# Patient Record
Sex: Female | Born: 1985 | Race: White | Hispanic: No | Marital: Married | Smoking: Never smoker
Health system: Southern US, Community
[De-identification: ages and names within clinical notes are randomized; demographics above are authoritative.]

## PROBLEM LIST (undated history)

## (undated) DIAGNOSIS — G4730 Sleep apnea, unspecified: Secondary | ICD-10-CM

## (undated) DIAGNOSIS — G43909 Migraine, unspecified, not intractable, without status migrainosus: Secondary | ICD-10-CM

## (undated) HISTORY — DX: Sleep apnea, unspecified: G47.30

## (undated) HISTORY — PX: OTHER SURGICAL HISTORY: SHX169

---
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
## 2010-11-29 ENCOUNTER — Ambulatory Visit: Payer: Self-pay | Admitting: Women's Health

## 2010-12-19 ENCOUNTER — Encounter: Payer: Self-pay | Admitting: Gynecology

## 2010-12-19 ENCOUNTER — Other Ambulatory Visit (HOSPITAL_COMMUNITY)
Admission: RE | Admit: 2010-12-19 | Discharge: 2010-12-19 | Disposition: A | Discharge status: Home / Self Care | Payer: Managed Care, Other (non HMO) | Source: Ambulatory Visit | Attending: Women's Health | Admitting: Women's Health

## 2010-12-19 ENCOUNTER — Ambulatory Visit (INDEPENDENT_AMBULATORY_CARE_PROVIDER_SITE_OTHER): Payer: Managed Care, Other (non HMO) | Admitting: Women's Health

## 2010-12-19 VITALS — BP 120/70 | Ht 65.0 in | Wt 161.0 lb

## 2010-12-19 DIAGNOSIS — Z01419 Encounter for gynecological examination (general) (routine) without abnormal findings: Secondary | ICD-10-CM

## 2010-12-19 DIAGNOSIS — J45909 Unspecified asthma, uncomplicated: Secondary | ICD-10-CM | POA: Insufficient documentation

## 2010-12-19 DIAGNOSIS — G4730 Sleep apnea, unspecified: Secondary | ICD-10-CM | POA: Insufficient documentation

## 2010-12-19 DIAGNOSIS — Z113 Encounter for screening for infections with a predominantly sexual mode of transmission: Secondary | ICD-10-CM

## 2010-12-19 DIAGNOSIS — IMO0001 Reserved for inherently not codable concepts without codable children: Secondary | ICD-10-CM

## 2010-12-19 DIAGNOSIS — Z309 Encounter for contraceptive management, unspecified: Secondary | ICD-10-CM

## 2010-12-19 MED ORDER — NORGESTIM-ETH ESTRAD TRIPHASIC 0.18/0.215/0.25 MG-35 MCG PO TABS: 1.0000 | ORAL_TABLET | Freq: Every day | ORAL | Status: AC

## 2010-12-19 NOTE — Progress Notes (Signed)
Lindsay Spencer 28-Jun-1985 [id number]    History:    The patient presents for annual exam.  Graduated from a culinary school.    Past medical history, past surgical history, family history and social history were all reviewed and documented in the EPIC chart.   ROS:  A  ROS was performed and pertinent positives and negatives are included in the history.  Exam:  Filed Vitals:   12/19/10 1527  BP: 120/70    General appearance:  Normal Head/Neck:  Normal, without cervical or supraclavicular adenopathy. Thyroid:  Symmetrical, normal in size, without palpable masses or nodularity. Respiratory  Effort:  Normal  Auscultation:  Clear without wheezing or rhonchi Cardiovascular  Auscultation:  Regular rate, without rubs, murmurs or gallops  Edema/varicosities:  Not grossly evident Abdominal  Soft,nontender, without masses, guarding or rebound.  Liver/spleen:  No organomegaly noted  Hernia:  None appreciated  Skin  Inspection:  Grossly normal  Palpation:  Grossly normal Neurologic/psychiatric  Orientation:  Normal with appropriate conversation.  Mood/affect:  Normal  Genitourinary    Breasts: Examined lying and sitting.     Right: Without masses, retractions, discharge or axillary adenopathy.     Left: Without masses, retractions, discharge or axillary adenopathy.   Inguinal/mons:  Normal without inguinal adenopathy  External genitalia:  Normal  BUS/Urethra/Skene's glands:  Normal  Bladder:  Normal  Vagina:  Normal  Cervix:  Normal  Uterus:   normal in size, shape and contour.  Midline and mobile  Adnexa/parametria:     Rt: Without masses or tenderness.   Lt: Without masses or tenderness.  Anus and perineum: Normal  Digital rectal exam: Normal sphincter tone without palpated masses or tenderness  Assessment/Plan:  25 y.o. MWF G0 for annual exam. 6 day monthly cycle on Ortho Tri-Cyclen with no complaints. History of asthma. History of normal Paps, did not receive  Gardasil. First partner, but not his. Donates blood on a regular basis. Had a normal CBC, lipid profile, glucose,UA last month at primary care.  Normal GYN exam  Plan: Ortho Tri-Cyclen prescription, proper use, slight risk for blood clots and strokes was reviewed. Pap, GC Chlamydia only. SBEs, exercise, MVI daily, calcium rich diet encouraged.   Harrington Challenger [facility], 5:28 PM 12/19/2010

## 2010-12-19 NOTE — Patient Instructions (Signed)
tdap vaccine   

## 2011-01-17 ENCOUNTER — Ambulatory Visit (INDEPENDENT_AMBULATORY_CARE_PROVIDER_SITE_OTHER): Payer: Managed Care, Other (non HMO) | Admitting: Women's Health

## 2011-01-17 ENCOUNTER — Encounter: Payer: Self-pay | Admitting: Women's Health

## 2011-01-17 DIAGNOSIS — N898 Other specified noninflammatory disorders of vagina: Secondary | ICD-10-CM

## 2011-01-17 DIAGNOSIS — N920 Excessive and frequent menstruation with regular cycle: Secondary | ICD-10-CM

## 2011-01-17 NOTE — Progress Notes (Signed)
Patient ID: Lindsay Spencer, female   DOB: March 09, 1985, 25 y.o.   MRN: [id number] Presents with a complaint of spotting on second week of pills. Has been on tri Sprintec for about 3 years and has not ever had spotting prior. Denies missed or late pills.  Same partner, negative cultures, denies discharge or urinary symptoms. Has not been on any other medications recently.  Exam: No CVAT, abdomen was soft nontender, external genitalia is within normal limits, speculum exam no noted blood or discharge or erythema. Bimanual no CMT or adnexal fullness or tenderness. U PT negative.  Spotting/unknown origin  Plan: Continue taking pills daily, return to office if no cycle at placebo week or if spotting continues.

## 2011-04-29 ENCOUNTER — Emergency Department (HOSPITAL_COMMUNITY)
Admission: EM | Admit: 2011-04-29 | Discharge: 2011-04-30 | Disposition: A | Discharge status: Home / Self Care | Payer: Managed Care, Other (non HMO) | Attending: Emergency Medicine | Admitting: Emergency Medicine

## 2011-04-29 ENCOUNTER — Encounter (HOSPITAL_COMMUNITY): Payer: Self-pay | Admitting: Emergency Medicine

## 2011-04-29 DIAGNOSIS — M545 Low back pain, unspecified: Secondary | ICD-10-CM | POA: Insufficient documentation

## 2011-04-29 DIAGNOSIS — R5381 Other malaise: Secondary | ICD-10-CM | POA: Insufficient documentation

## 2011-04-29 DIAGNOSIS — R110 Nausea: Secondary | ICD-10-CM

## 2011-04-29 DIAGNOSIS — R51 Headache: Secondary | ICD-10-CM

## 2011-04-29 DIAGNOSIS — R42 Dizziness and giddiness: Secondary | ICD-10-CM | POA: Insufficient documentation

## 2011-04-29 DIAGNOSIS — R531 Weakness: Secondary | ICD-10-CM

## 2011-04-29 HISTORY — DX: Migraine, unspecified, not intractable, without status migrainosus: G43.909

## 2011-04-29 NOTE — ED Notes (Signed)
Pt states she feels dehydrated  Sxs include: dizziness, headache, nausea, shortness of breath, lethargic, feeling hot then having chills

## 2011-04-30 LAB — URINALYSIS, ROUTINE W REFLEX MICROSCOPIC
Bilirubin Urine: NEGATIVE
Glucose, UA: NEGATIVE mg/dL
Ketones, ur: 15 mg/dL — AB
Specific Gravity, Urine: 1.032 — ABNORMAL HIGH (ref 1.005–1.030)
pH: 6 (ref 5.0–8.0)

## 2011-04-30 LAB — DIFFERENTIAL
Eosinophils Relative: 0 % (ref 0–5)
Lymphocytes Relative: 25 % (ref 12–46)
Lymphs Abs: 1.6 10*3/uL (ref 0.7–4.0)
Monocytes Absolute: 0.4 10*3/uL (ref 0.1–1.0)
Monocytes Relative: 6 % (ref 3–12)

## 2011-04-30 LAB — CBC
HCT: 39.4 % (ref 36.0–46.0)
Hemoglobin: 13.1 g/dL (ref 12.0–15.0)
MCV: 90.4 fL (ref 78.0–100.0)
RBC: 4.36 MIL/uL (ref 3.87–5.11)
WBC: 6.3 10*3/uL (ref 4.0–10.5)

## 2011-04-30 LAB — POCT I-STAT, CHEM 8
BUN: 16 mg/dL (ref 6–23)
Calcium, Ion: 1.16 mmol/L (ref 1.12–1.32)
Chloride: 107 mEq/L (ref 96–112)
Glucose, Bld: 100 mg/dL — ABNORMAL HIGH (ref 70–99)
Potassium: 3.8 mEq/L (ref 3.5–5.1)

## 2011-04-30 LAB — POCT PREGNANCY, URINE: Preg Test, Ur: NEGATIVE

## 2011-04-30 MED ORDER — SODIUM CHLORIDE 0.9 % IV BOLUS (SEPSIS)
1000.0000 mL | Freq: Once | INTRAVENOUS | Status: AC
  Administered 2011-04-30: 1000 mL via INTRAVENOUS

## 2011-04-30 MED ORDER — ONDANSETRON HCL 4 MG/2ML IJ SOLN
4.0000 mg | Freq: Once | INTRAMUSCULAR | Status: AC
  Administered 2011-04-30: 4 mg via INTRAVENOUS
  Filled 2011-04-30: qty 2

## 2011-04-30 MED ORDER — FENTANYL CITRATE 0.05 MG/ML IJ SOLN
50.0000 ug | Freq: Once | INTRAMUSCULAR | Status: AC
  Administered 2011-04-30: 50 ug via INTRAVENOUS
  Filled 2011-04-30: qty 2

## 2011-04-30 MED ORDER — ONDANSETRON HCL 4 MG PO TABS: 4.0000 mg | ORAL_TABLET | Freq: Four times a day (QID) | ORAL | Status: AC

## 2011-04-30 NOTE — Discharge Instructions (Signed)
Stay well hydrated, sipping on fluids throughout the day. Use zofran as needed for nausea. Establishment with a Primary Care provider is Very important for general health care concerns, minor illness and minor injury and for further evaluation and management of ongoing symptoms however return to ER for changing or worsening of symptoms.  Fatigue Fatigue is a feeling of tiredness, lack of energy, lack of motivation, or feeling tired all the time. Having enough rest, good nutrition, and reducing stress will normally reduce fatigue. Consult your caregiver if it persists. The nature of your fatigue will help your caregiver to find out its cause. The treatment is based on the cause.  CAUSES  There are many causes for fatigue. Most of the time, fatigue can be traced to one or more of your habits or routines. Most causes fit into one or more of three general areas. They are: Lifestyle problems  Sleep disturbances.   Overwork.   Physical exertion.   Unhealthy habits.   Poor eating habits or eating disorders.   Alcohol and/or drug use .   Lack of proper nutrition (malnutrition).  Psychological problems  Stress and/or anxiety problems.   Depression.   Grief.   Boredom.  Medical Problems or Conditions  Anemia.   Pregnancy.   Thyroid gland problems.   Recovery from major surgery.   Continuous pain.   Emphysema or asthma that is not well controlled   Allergic conditions.   Diabetes.   Infections (such as mononucleosis).   Obesity.   Sleep disorders, such as sleep apnea.   Heart failure or other heart-related problems.   Cancer.   Kidney disease.   Liver disease.   Effects of certain medicines such as antihistamines, cough and cold remedies, prescription pain medicines, heart and blood pressure medicines, drugs used for treatment of cancer, and some antidepressants.  SYMPTOMS  The symptoms of fatigue include:   Lack of energy.   Lack of drive (motivation).    Drowsiness.   Feeling of indifference to the surroundings.  DIAGNOSIS  The details of how you feel help guide your caregiver in finding out what is causing the fatigue. You will be asked about your present and past health condition. It is important to review all medicines that you take, including prescription and non-prescription items. A thorough exam will be done. You will be questioned about your feelings, habits, and normal lifestyle. Your caregiver may suggest blood tests, urine tests, or other tests to look for common medical causes of fatigue.  TREATMENT  Fatigue is treated by correcting the underlying cause. For example, if you have continuous pain or depression, treating these causes will improve how you feel. Similarly, adjusting the dose of certain medicines will help in reducing fatigue.  HOME CARE INSTRUCTIONS   Try to get the required amount of good sleep every night.   Eat a healthy and nutritious diet, and drink enough water throughout the day.   Practice ways of relaxing (including yoga or meditation).   Exercise regularly.   Make plans to change situations that cause stress. Act on those plans so that stresses decrease over time. Keep your work and personal routine reasonable.   Avoid street drugs and minimize use of alcohol.   Start taking a daily multivitamin after consulting your caregiver.  SEEK MEDICAL CARE IF:   You have persistent tiredness, which cannot be accounted for.   You have fever.   You have unintentional weight loss.   You have headaches.   You have disturbed  sleep throughout the night.   You are feeling sad.   You have constipation.   You have dry skin.   You have gained weight.   You are taking any new or different medicines that you suspect are causing fatigue.   You are unable to sleep at night.   You develop any unusual swelling of your legs or other parts of your body.  SEEK IMMEDIATE MEDICAL CARE IF:   You are feeling  confused.   Your vision is blurred.   You feel faint or pass out.   You develop severe headache.   You develop severe abdominal, pelvic, or back pain.   You develop chest pain, shortness of breath, or an irregular or fast heartbeat.   You are unable to pass a normal amount of urine.   You develop abnormal bleeding such as bleeding from the rectum or you vomit blood.   You have thoughts about harming yourself or committing suicide.   You are worried that you might harm someone else.  MAKE SURE YOU:   Understand these instructions.   Will watch your condition.   Will get help right away if you are not doing well or get worse.  Document Released: 11/18/2006 Document Revised: 01/10/2011 Document Reviewed: 11/18/2006 [facility] Patient Information 2012 [place], [place].

## 2011-04-30 NOTE — ED Notes (Signed)
Patient c/o dizziness; lightheadedness; nausea; lower back pain, 8/10 and feeling dehydrated since Monday AM. Reports giving blood on Friday. Denies emesis or diarrhea.

## 2011-04-30 NOTE — ED Provider Notes (Signed)
History     CSN: [id number]  Arrival date & time 04/29/11  2129   First MD Initiated Contact with Patient 04/30/11 0031      Chief Complaint  Patient presents with  . Nausea  . Headache    (Consider location/radiation/quality/duration/timing/severity/associated sxs/prior treatment) HPI  Patient presents to emergency department complaining of gradual onset generalized weakness, mild dizziness, and nausea. Patient states that she gave blood on Friday and when she woke Saturday he felt generally fatigued with mild nausea. Patient states that on Sunday she had ongoing general weakness, and had gradual onset headache with mild pain in her lower back as well as feeling hot and cold. Patient denies any known fever, dysuria, hematuria, abdominal pain, chest pain, or shortness of breath. Patient states she has history of mild asthma and sleep apnea as well as history of migraines. Patient takes daily birth control and as needed albuterol but has no other daily medications. Patient states symptoms were gradual onset, persistent, and worsening. She denies any vomiting or diarrhea. Patient states that she is attempted to drink some ginger ale and eat some soup however it made the nausea worse.  Past Medical History  Diagnosis Date  . Asthma   . Sleep apnea   . Migraines     Past Surgical History  Procedure Date  . Cyst removed from back     Family History  Problem Relation Age of Onset  . Hypertension Father   . Breast cancer Maternal Grandmother     History  Substance Use Topics  . Smoking status: Never Smoker   . Smokeless tobacco: Never Used  . Alcohol Use: Yes     rare    OB History    Grav Para Term Preterm Abortions TAB SAB Ect Mult Living   0               Review of Systems  All other systems reviewed and are negative.    Allergies  Amoxicillin  Home Medications   Current Outpatient Rx  Name Route Sig Dispense Refill  . ALBUTEROL SULFATE HFA 108 (90 BASE)  MCG/ACT IN AERS Inhalation Inhale 2 puffs into the lungs every 6 (six) hours as needed. For shortness of breath.    Darlis Loan ESTRAD TRIPHASIC 0.18/0.215/0.25 MG-35 MCG PO TABS Oral Take 1 tablet by mouth daily. 1 Package 12    BP 123/81  Pulse 91  Temp(Src) 97.8 F (36.6 C) (Oral)  Resp 18  SpO2 100%  LMP 04/21/2011  Physical Exam  Nursing note and vitals reviewed. Constitutional: She is oriented to person, place, and time. She appears well-developed and well-nourished. No distress.  HENT:  Head: Normocephalic and atraumatic.  Eyes: Conjunctivae are normal.  Neck: Normal range of motion. Neck supple. No Brudzinski's sign and no Kernig's sign noted.  Cardiovascular: Normal rate, regular rhythm, normal heart sounds and intact distal pulses.  Exam reveals no gallop and no friction rub.   No murmur heard. Pulmonary/Chest: Effort normal and breath sounds normal. No respiratory distress. She has no wheezes. She has no rales. She exhibits no tenderness.  Abdominal: Soft. Bowel sounds are normal. She exhibits no distension and no mass. There is no tenderness. There is no rebound and no guarding.       No CVA TTP.   Musculoskeletal: Normal range of motion. She exhibits no edema and no tenderness.  Neurological: She is alert and oriented to person, place, and time. No cranial nerve deficit. Coordination normal.  Skin: Skin  is warm and dry. No rash noted. She is not diaphoretic. No erythema.  Psychiatric: She has a normal mood and affect.    ED Course  Procedures (including critical care time)  IV fluids, zofran and fentanyl.   Labs Reviewed - No data to display No results found.     MDM  Patient is afebrile and nontoxic-appearing. Mild headache has resolved. No meningeal signs. She has no neuro focal findings. Patient is ambulating without difficulty. No ataxia. Patient has vague complaints of general fatigue, mild nausea, lightheadedness, and lower back pain however no acute  findings on labs. Denies CP, SOB or abdominal pain. No significant findings on physical exam. Spoke at length with patient about precautions and reasons to return to ER. She voices her understanding and is agreeable to plan. She is agreeable to following up with primary care physician for ongoing vague complaints but  to return to emergency department for changing or worsening symptoms.        Jenness Corner, PA 04/30/11 0303  Jenness Corner, PA 04/30/11 0304  Medical screening examination/treatment/procedure(s) were performed by non-physician practitioner and as supervising physician I was immediately available for consultation/collaboration.  Sunnie Nielsen, MD 05/01/11 [phone]

## 2012-11-23 DIAGNOSIS — F332 Major depressive disorder, recurrent severe without psychotic features: Secondary | ICD-10-CM | POA: Insufficient documentation

## 2016-03-27 ENCOUNTER — Emergency Department
Admission: EM | Admit: 2016-03-27 | Discharge: 2016-03-27 | Disposition: A | Discharge status: Home / Self Care | Payer: No Typology Code available for payment source | Attending: Emergency Medicine | Admitting: Emergency Medicine

## 2016-03-27 ENCOUNTER — Encounter: Payer: Self-pay | Admitting: Emergency Medicine

## 2016-03-27 DIAGNOSIS — J45909 Unspecified asthma, uncomplicated: Secondary | ICD-10-CM | POA: Diagnosis not present

## 2016-03-27 DIAGNOSIS — S46912A Strain of unspecified muscle, fascia and tendon at shoulder and upper arm level, left arm, initial encounter: Secondary | ICD-10-CM | POA: Diagnosis not present

## 2016-03-27 DIAGNOSIS — Z79899 Other long term (current) drug therapy: Secondary | ICD-10-CM | POA: Insufficient documentation

## 2016-03-27 DIAGNOSIS — S4992XA Unspecified injury of left shoulder and upper arm, initial encounter: Secondary | ICD-10-CM | POA: Diagnosis present

## 2016-03-27 DIAGNOSIS — Y999 Unspecified external cause status: Secondary | ICD-10-CM | POA: Diagnosis not present

## 2016-03-27 DIAGNOSIS — S161XXA Strain of muscle, fascia and tendon at neck level, initial encounter: Secondary | ICD-10-CM | POA: Insufficient documentation

## 2016-03-27 DIAGNOSIS — Y92410 Unspecified street and highway as the place of occurrence of the external cause: Secondary | ICD-10-CM | POA: Insufficient documentation

## 2016-03-27 DIAGNOSIS — Y939 Activity, unspecified: Secondary | ICD-10-CM | POA: Diagnosis not present

## 2016-03-27 MED ORDER — CYCLOBENZAPRINE HCL 5 MG PO TABS: 5.0000 mg | ORAL_TABLET | Freq: Three times a day (TID) | ORAL | 0 refills | Status: AC | PRN

## 2016-03-27 MED ORDER — IBUPROFEN 600 MG PO TABS: 600.0000 mg | ORAL_TABLET | Freq: Four times a day (QID) | ORAL | 0 refills | Status: AC | PRN
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
## 2016-03-27 NOTE — Discharge Instructions (Signed)
Please ice the shoulder and neck muscles 20 minutes every hour for the next 2 days. Transition to heat after 2 days. Take ibuprofen and Flexeril as needed. Follow-up with orthopedics if no improvement in 5-7 days. Return to the ER for any worsening symptoms urgent changes in health.
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
## 2016-08-29 ENCOUNTER — Encounter: Payer: Self-pay | Admitting: Dietician

## 2016-08-29 ENCOUNTER — Encounter: Payer: BC Managed Care – PPO | Attending: Student | Admitting: Dietician

## 2016-08-29 VITALS — Ht 65.0 in | Wt 235.8 lb

## 2016-08-29 DIAGNOSIS — F32A Depression, unspecified: Secondary | ICD-10-CM | POA: Insufficient documentation

## 2016-08-29 DIAGNOSIS — F329 Major depressive disorder, single episode, unspecified: Secondary | ICD-10-CM | POA: Insufficient documentation

## 2016-08-29 DIAGNOSIS — E6609 Other obesity due to excess calories: Secondary | ICD-10-CM | POA: Diagnosis not present

## 2016-08-29 DIAGNOSIS — F603 Borderline personality disorder: Secondary | ICD-10-CM | POA: Insufficient documentation

## 2016-08-29 DIAGNOSIS — Z6839 Body mass index (BMI) 39.0-39.9, adult: Secondary | ICD-10-CM | POA: Diagnosis not present

## 2016-08-29 NOTE — Patient Instructions (Addendum)
-   Use MyFitness Pal app to track calories daily. Ratio of macros will be 40/40/20 (carbs,protein,fat) at ~2000 cals per day. Use the calorie goal provided for approx 2 weeks. If no weight is lost, subtract 250 calories from daily calorie goal.  - Eating well and Healthy choice frozen meals are good brands to consider when choosing frozen meals  - Practice becoming familiar with proper portions again; use a food scale and/or measuring cups as needed  - Continue on your exercise regimen, great job!

## 2016-08-29 NOTE — Progress Notes (Signed)
Medical Nutrition Therapy: Visit start time: 1500  end time: 1600  Assessment:  Diagosis: obesity Past medical history: depression, sleep apnea, see chart Psychosocial issues/ stress concerns: depression Preferred learning method:  . No preference indicated  Current weight: 235.8lb w/shoes Height: 5'5" Medications, supplements: MVI, Ca+ Vit D3, Omega 3, see chart for full list  Progress and evaluation: patient is seeking accountability to help her re-lose weight that has been gained. CBW is 7# above last recorded wt of 228# (08/14/16) though was weighed with shoes on today. Goal wt 170# which she was last at in 2013. Reports previous wt loss success when seeing a dietitian regularly in the past. Reported wt loss of 20-25# during that time. Strategies that she reports to have worked for her were: tracking daily caloric intake via MyFitnessPal app, putting strategies in place to start meal prepping, incorporating more diet variety, eating more fruits and vegetables. States she does not feel that she overeats but does not feel that the foods she does eat are healthy choices. Recent interventions include re-starting an exercise regimen, trying to limit snacking between meals, keeping tempting foods out of the house, and keeping snack portion sizes in check particularly in the evening. Dinner time is the only time that pt gets to see her husband during the week and thus does not want to spend that time cooking, though she did go to [employer] and enjoys it, thus choices are often frozen / prepackaged meals. She reports not being motivated to cook for just herself.    Physical activity: 3x/wk weight training for 1.5hrs each with husband. Regimen typically 10 total exercises, each exercise is done for 3 sets of 8-10 reps. Has done this 5 times so far. Did Crossfit in the past and is getting back into shape.  Dietary Intake:  Usual eating pattern includes 2-3 meals and 1-2 snacks per day. Dining out  frequency: 1-2 meals per week.   Breakfast: sometimes skips. Premier protein shakes, cereal (rice krispys, special k) Snack: n/a Lunch: tuna packet+ good thin crackers+ laughing cow cheese+ 100% juice fruit cup, string cheese, sometimes a sandwich Snack: chips Supper: whatever is quick/convenient: ravioli, Hot dog, fried egg sandwich, ramen Snack: dessert, 3-4 bite sized kit kat Beverages: 1-2 sodas per week, lemonade, water mostly, "light" apple juice  Nutrition Care Education: Topics covered: proper portions for all food groups, relation of exercise to weight loss, tracking caloric intake accurately, importance of diet variety, recipe and meal prep ideas, smart snacking, healthy options in the frozen section of the grocery store-- what to look for, portion control for snacking, adding food groups to "fast food" type meals IE ramen, consuming adequate calories to fuel and to recover from activity, ways to reduce fat and calories Basic nutrition: basic food groups, appropriate nutrient balance, appropriate meal and snack schedule, general nutrition guidelines    Weight control: benefits of weight control, determining reasonable weight goal, behavioral changes for weight loss Advanced nutrition:  recipe modification, cooking techniques, dining out Hyperlipidemia: healthy and unhealthy fats, role of fiber Other lifestyle changes:  benefits of making changes, increasing motivation, readiness for change, identifying habits that need to change  Nutritional Diagnosis:  [place]-3.3 Overweight/obesity As related to lifestyle habits.  As evidenced by BMI 39.23.  Intervention: Discussion as noted above. Patient was provided estimated daily calorie goals to follow using the MyFitnessPal app, with instructions on how to adjust calories should weight loss not occur after 2 weeks duration. Goals were established which patient is  agreeable to work on (at least 1-2) until follow up appointment 09/26/16.  Education  Materials given:  [employer] Guidelines for Losing Weight . Quick and Healthy Meals . Goals/ instructions  Learner/ who was taught:  . Patient  Level of understanding: Marland Kitchen Verbalizes/ demonstrates competency  Demonstrated degree of understanding via:   Teach back Learning barriers: . None  Willingness to learn/ readiness for change: . Acceptance, ready for change  Monitoring and Evaluation:  Dietary intake, exercise, and body weight      follow up: in 4 week(s)

## 2016-09-25 ENCOUNTER — Ambulatory Visit: Payer: BC Managed Care – PPO | Admitting: Dietician

## 2017-05-13 ENCOUNTER — Other Ambulatory Visit: Payer: Self-pay | Admitting: Neurology

## 2017-05-13 DIAGNOSIS — G35 Multiple sclerosis: Secondary | ICD-10-CM

## 2017-05-19 ENCOUNTER — Ambulatory Visit
Admission: RE | Admit: 2017-05-19 | Discharge: 2017-05-19 | Disposition: A | Discharge status: Home / Self Care | Payer: BC Managed Care – PPO | Source: Ambulatory Visit | Attending: Neurology | Admitting: Neurology

## 2017-05-19 DIAGNOSIS — R200 Anesthesia of skin: Secondary | ICD-10-CM | POA: Diagnosis not present

## 2017-05-19 DIAGNOSIS — G35 Multiple sclerosis: Secondary | ICD-10-CM | POA: Insufficient documentation

## 2017-06-11 ENCOUNTER — Other Ambulatory Visit: Payer: Self-pay | Admitting: Neurology

## 2017-06-11 DIAGNOSIS — R209 Unspecified disturbances of skin sensation: Secondary | ICD-10-CM

## 2017-06-21 ENCOUNTER — Ambulatory Visit
Admission: RE | Admit: 2017-06-21 | Discharge: 2017-06-21 | Disposition: A | Discharge status: Home / Self Care | Payer: BC Managed Care – PPO | Source: Ambulatory Visit | Attending: Neurology | Admitting: Neurology

## 2017-06-21 DIAGNOSIS — R202 Paresthesia of skin: Secondary | ICD-10-CM | POA: Insufficient documentation

## 2017-06-21 DIAGNOSIS — R209 Unspecified disturbances of skin sensation: Secondary | ICD-10-CM | POA: Diagnosis not present

## 2017-06-21 DIAGNOSIS — M542 Cervicalgia: Secondary | ICD-10-CM | POA: Diagnosis present

## 2017-06-21 DIAGNOSIS — R208 Other disturbances of skin sensation: Secondary | ICD-10-CM | POA: Insufficient documentation

## 2017-06-21 DIAGNOSIS — R29898 Other symptoms and signs involving the musculoskeletal system: Secondary | ICD-10-CM | POA: Diagnosis present

## 2018-09-01 IMAGING — MR MR CERVICAL SPINE W/O CM
5 series · 36 of 48 positions shown · non-contrast
Comparison: MRI head 05/19/2017

CLINICAL DATA: Sensory disturbance

EXAM:
MRI CERVICAL SPINE WITHOUT CONTRAST
TECHNIQUE: Multiplanar, multisequence MR imaging of the cervical spine was
performed. No intravenous contrast was administered.

[Series 3: T2 · sagittal · 3.0mm · 0.82mm/px · 8 of 15 slices shown (1 of 2)]
[im 1/15]
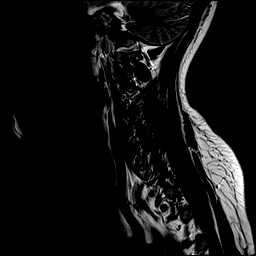
[im 3/15]
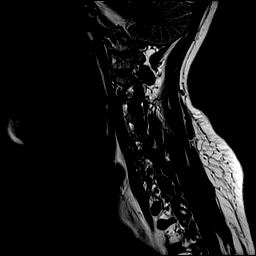
[im 5/15]
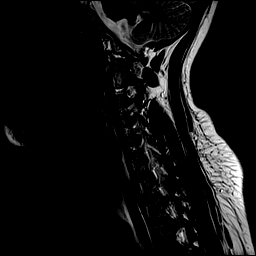
[im 7/15]
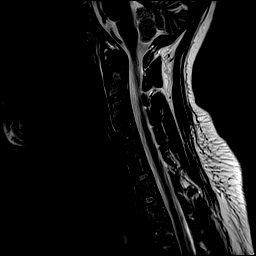
[im 9/15]
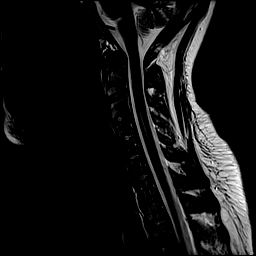
[im 11/15]
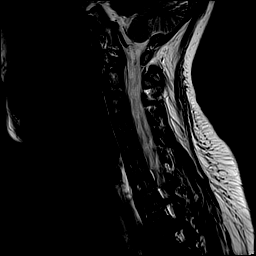
[im 13/15]
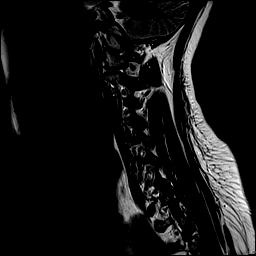
[im 15/15]
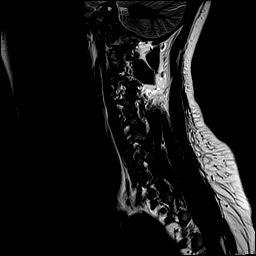

[Series 4: T1 · sagittal · 3.0mm · 0.82mm/px · 7 of 15 slices shown]
[im 1/15]
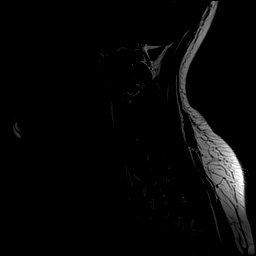
[im 3/15]
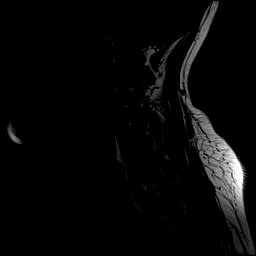
[im 5/15]
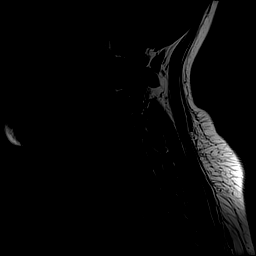
[im 8/15]
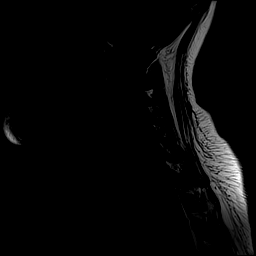
[im 10/15]
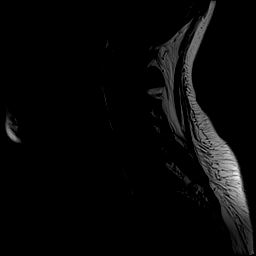
[im 12/15]
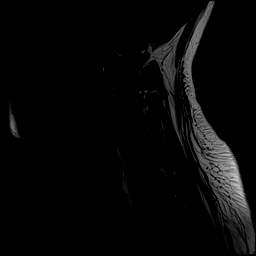
[im 15/15]
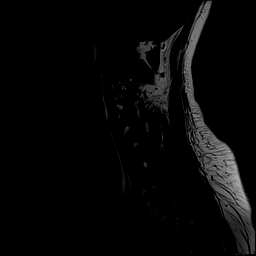

[Series 5: STIR · sagittal · 3.0mm · 0.41mm/px · 7 of 15 slices shown]
[im 1/15]
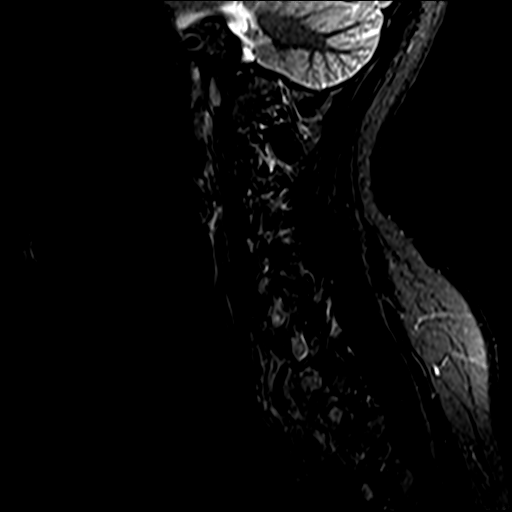
[im 3/15]
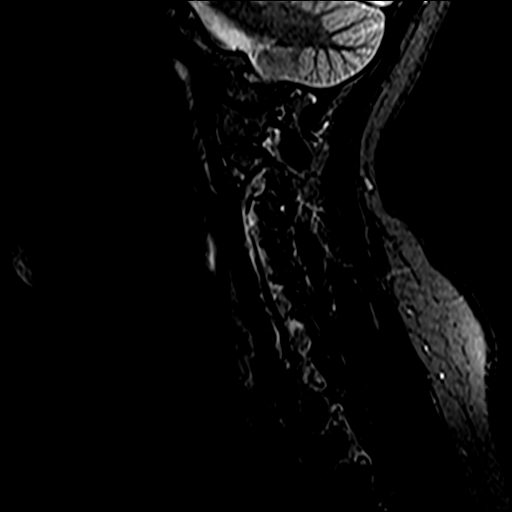
[im 5/15]
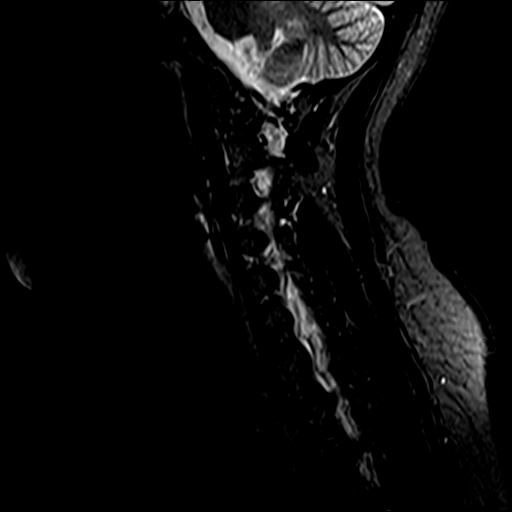
[im 8/15]
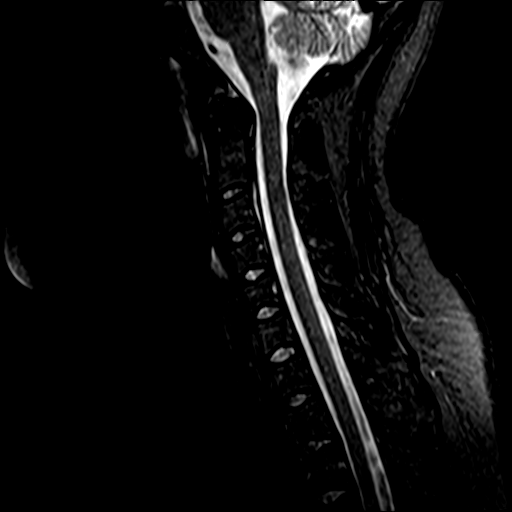
[im 10/15]
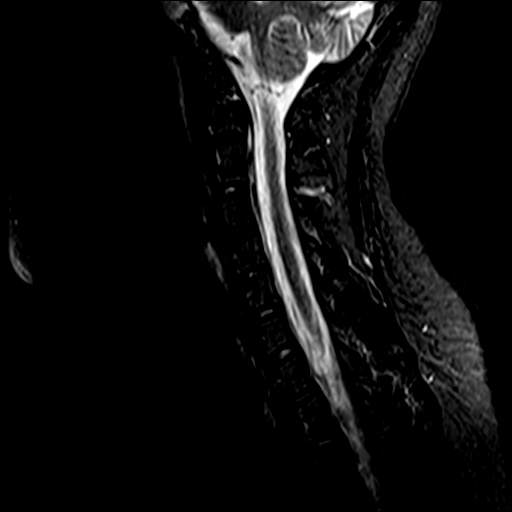
[im 12/15]
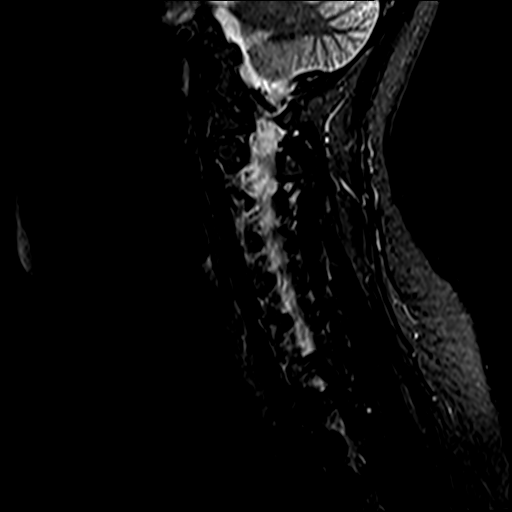
[im 15/15]
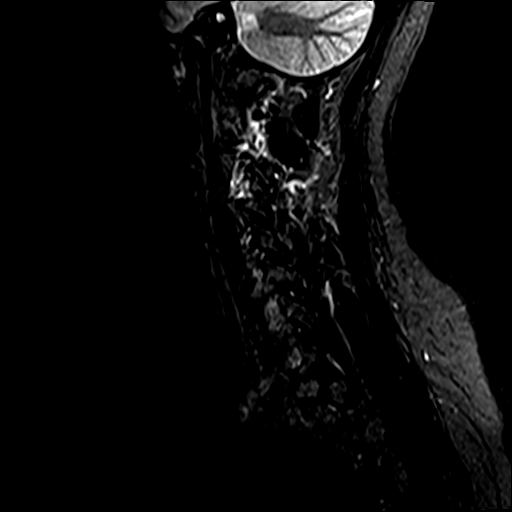

[Series 6: T2 · axial · 3.0mm · 0.70mm/px · z∈[-91,+10]mm · 9 of 28 slices shown (2 of 2)]
[im 1/28]
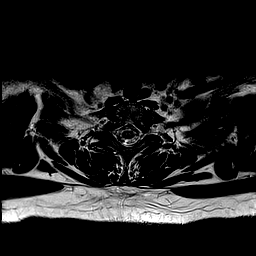
[im 5/28]
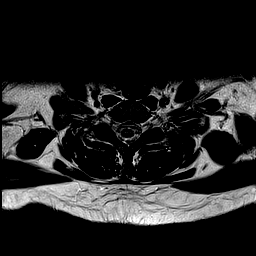
[im 10/28]
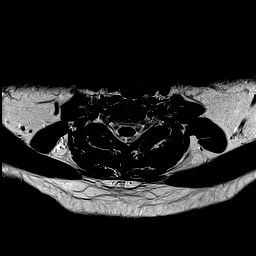
[im 12/28]
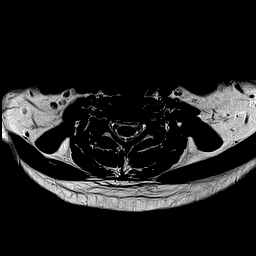
[im 14/28]
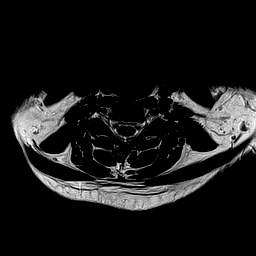
[im 16/28]
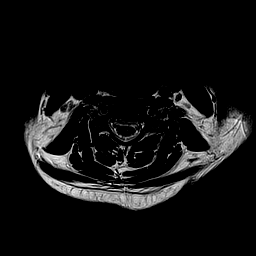
[im 19/28]
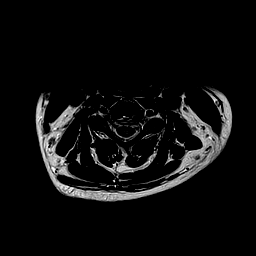
[im 23/28]
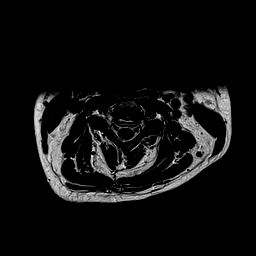
[im 28/28]
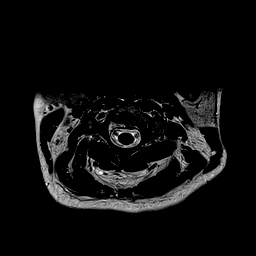

[Series 7: mpgr ax · axial · 3.0mm · 0.35mm/px · z∈[-91,-35]mm · 5 of 28 slices shown]
[im 1/28]
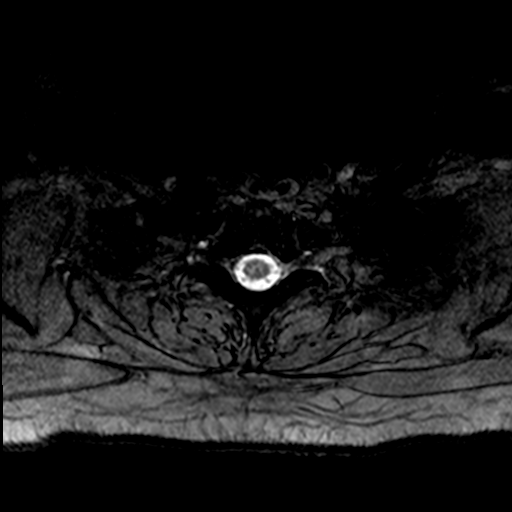
[im 5/28]
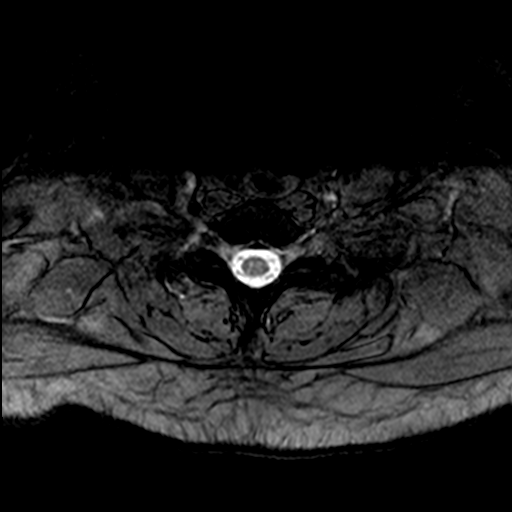
[im 10/28]
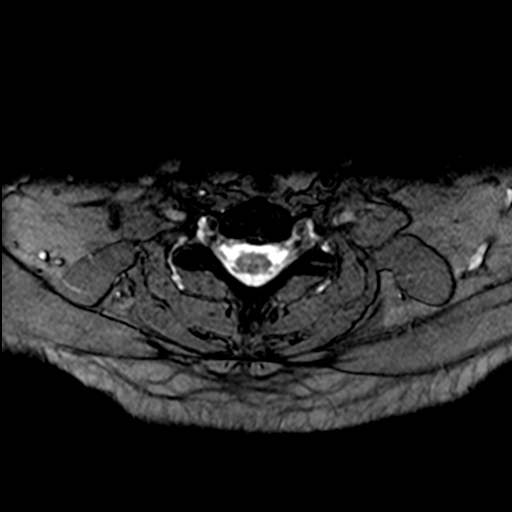
[im 12/28]
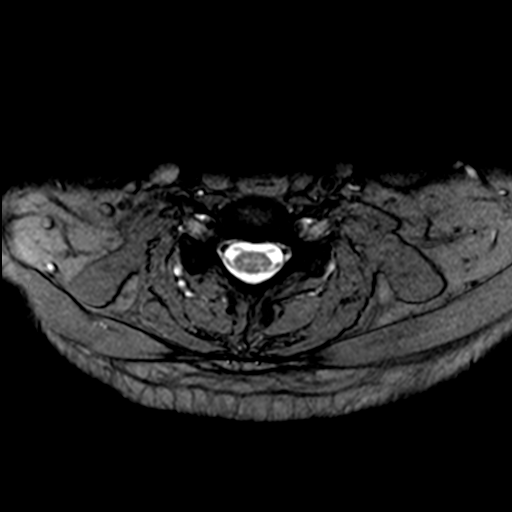
[im 16/28]
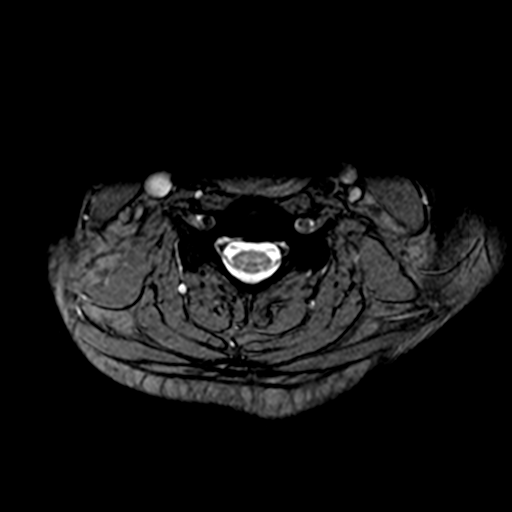

[36 of 48 positions shown; findings below may reference images not displayed]

FINDINGS: Alignment: Normal

Vertebrae: Normal

Cord: Normal spinal cord. T2 hyperintensity in the central cord is
felt to be artifact and not confirmed on other sequences.

Posterior Fossa, vertebral arteries, paraspinal tissues: Negative

Disc levels:

Normal disc spaces. No disc protrusion. Normal facet or joints.
Stenosis
IMPRESSION: Negative MRI cervical spine
